# Patient Record
Sex: Male | Born: 1995 | Race: White | Hispanic: No | Marital: Single
Health system: Southern US, Community
[De-identification: ages and names within clinical notes are randomized; demographics above are authoritative.]

---
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
## 2015-11-30 ENCOUNTER — Emergency Department (HOSPITAL_COMMUNITY): Payer: Self-pay

## 2015-11-30 ENCOUNTER — Emergency Department (HOSPITAL_COMMUNITY)
Admission: EM | Admit: 2015-11-30 | Discharge: 2015-12-01 | Disposition: A | Discharge status: Home / Self Care | Payer: Self-pay | Attending: Emergency Medicine | Admitting: Emergency Medicine

## 2015-11-30 ENCOUNTER — Encounter (HOSPITAL_COMMUNITY): Payer: Self-pay

## 2015-11-30 DIAGNOSIS — T50901A Poisoning by unspecified drugs, medicaments and biological substances, accidental (unintentional), initial encounter: Secondary | ICD-10-CM | POA: Insufficient documentation

## 2015-11-30 DIAGNOSIS — S43401A Unspecified sprain of right shoulder joint, initial encounter: Secondary | ICD-10-CM | POA: Insufficient documentation

## 2015-11-30 DIAGNOSIS — Y939 Activity, unspecified: Secondary | ICD-10-CM | POA: Insufficient documentation

## 2015-11-30 DIAGNOSIS — Y999 Unspecified external cause status: Secondary | ICD-10-CM | POA: Insufficient documentation

## 2015-11-30 DIAGNOSIS — T50904A Poisoning by unspecified drugs, medicaments and biological substances, undetermined, initial encounter: Secondary | ICD-10-CM

## 2015-11-30 DIAGNOSIS — R000 Tachycardia, unspecified: Secondary | ICD-10-CM | POA: Insufficient documentation

## 2015-11-30 DIAGNOSIS — Y929 Unspecified place or not applicable: Secondary | ICD-10-CM | POA: Insufficient documentation

## 2015-11-30 DIAGNOSIS — X58XXXA Exposure to other specified factors, initial encounter: Secondary | ICD-10-CM | POA: Insufficient documentation

## 2015-11-30 LAB — COMPREHENSIVE METABOLIC PANEL
ALT: 16 U/L — AB (ref 17–63)
AST: 26 U/L (ref 15–41)
Albumin: 4.3 g/dL (ref 3.5–5.0)
Alkaline Phosphatase: 47 U/L (ref 38–126)
Anion gap: 11 (ref 5–15)
BUN: 11 mg/dL (ref 6–20)
CALCIUM: 8.9 mg/dL (ref 8.9–10.3)
CO2: 21 mmol/L — ABNORMAL LOW (ref 22–32)
CREATININE: 1.33 mg/dL — AB (ref 0.61–1.24)
Chloride: 106 mmol/L (ref 101–111)
GLUCOSE: 227 mg/dL — AB (ref 65–99)
Potassium: 3.4 mmol/L — ABNORMAL LOW (ref 3.5–5.1)
SODIUM: 138 mmol/L (ref 135–145)
TOTAL PROTEIN: 7.2 g/dL (ref 6.5–8.1)
Total Bilirubin: 0.6 mg/dL (ref 0.3–1.2)

## 2015-11-30 LAB — CBC WITH DIFFERENTIAL/PLATELET
Basophils Absolute: 0 10*3/uL (ref 0.0–0.1)
Basophils Relative: 0 %
EOS ABS: 0 10*3/uL (ref 0.0–0.7)
EOS PCT: 0 %
HCT: 40.6 % (ref 39.0–52.0)
HEMOGLOBIN: 13.5 g/dL (ref 13.0–17.0)
LYMPHS ABS: 1.3 10*3/uL (ref 0.7–4.0)
Lymphocytes Relative: 14 %
MCH: 28.7 pg (ref 26.0–34.0)
MCHC: 33.3 g/dL (ref 30.0–36.0)
MCV: 86.4 fL (ref 78.0–100.0)
MONO ABS: 0.5 10*3/uL (ref 0.1–1.0)
MONOS PCT: 6 %
NEUTROS PCT: 80 %
Neutro Abs: 7.8 10*3/uL — ABNORMAL HIGH (ref 1.7–7.7)
PLATELETS: 159 10*3/uL (ref 150–400)
RBC: 4.7 MIL/uL (ref 4.22–5.81)
RDW: 12.2 % (ref 11.5–15.5)
WBC: 9.7 10*3/uL (ref 4.0–10.5)

## 2015-11-30 LAB — CK: Total CK: 159 U/L (ref 49–397)

## 2015-11-30 LAB — RAPID URINE DRUG SCREEN, HOSP PERFORMED
AMPHETAMINES: NOT DETECTED
Barbiturates: NOT DETECTED
Benzodiazepines: NOT DETECTED
Cocaine: NOT DETECTED
Opiates: NOT DETECTED
TETRAHYDROCANNABINOL: NOT DETECTED

## 2015-11-30 LAB — CBG MONITORING, ED
GLUCOSE-CAPILLARY: 171 mg/dL — AB (ref 65–99)
GLUCOSE-CAPILLARY: 98 mg/dL (ref 65–99)

## 2015-11-30 LAB — I-STAT CG4 LACTIC ACID, ED
Lactic Acid, Venous: 4 mmol/L (ref 0.5–2.0)
Lactic Acid, Venous: 0.96 mmol/L (ref 0.5–2.0)

## 2015-11-30 MED ORDER — SODIUM CHLORIDE 0.9 % IV BOLUS (SEPSIS)
1000.0000 mL | Freq: Once | INTRAVENOUS | Status: AC
  Administered 2015-11-30: 1000 mL via INTRAVENOUS

## 2015-11-30 MED ORDER — ONDANSETRON HCL 4 MG/2ML IJ SOLN
INTRAMUSCULAR | Status: AC
  Filled 2015-11-30: qty 2

## 2015-11-30 MED ORDER — NALOXONE HCL 2 MG/2ML IJ SOSY
1.0000 mg/h | PREFILLED_SYRINGE | INTRAVENOUS | Status: DC
  Filled 2015-11-30: qty 4

## 2015-11-30 MED ORDER — ONDANSETRON HCL 4 MG/2ML IJ SOLN
4.0000 mg | Freq: Once | INTRAMUSCULAR | Status: AC
  Administered 2015-11-30: 4 mg via INTRAVENOUS

## 2015-11-30 MED ORDER — NALOXONE HCL 2 MG/2ML IJ SOSY
1.0000 mg | PREFILLED_SYRINGE | Freq: Once | INTRAMUSCULAR | Status: AC
  Administered 2015-11-30: 1 mg via INTRAVENOUS

## 2015-11-30 NOTE — ED Notes (Signed)
Patient transported to CT 

## 2015-11-30 NOTE — ED Notes (Signed)
Pt informed of the need of a urine sample. Pt attempted to urinate but was unable to do so. Will try again later.

## 2015-11-30 NOTE — ED Notes (Signed)
Pt reports taking a pill and he stated he does not know what it was and has never taken it before, responds to narcan.

## 2015-11-30 NOTE — ED Notes (Signed)
Pt comes from a friends house, apneic on sceen, given 2mg  Narcan and another 1 mg given. Pt then became combative and unresponsive again. Pupils now dilated, flows some commands.

## 2015-11-30 NOTE — ED Provider Notes (Signed)
CSN: [id number]     Arrival date & time 11/30/15  2002 History   First MD Initiated Contact with Patient 11/30/15 2009     Chief Complaint  Patient presents with  . Drug Overdose     HPI Comments: 20 y.o. Male presents to the ED due to an ingestion of an unknown substance. He was initially apneic on scene at his friend's house, and responded to Narcan. Became very combative with EMS. Upon arrival, he is able to state his name.   Patient is a 20 y.o. male presenting with Ingested Medication. The history is provided by the patient.  Ingestion This is a new problem. The current episode started today. Episode frequency: Unable to specify. The problem has been gradually improving. Nothing aggravates the symptoms. Treatments tried: Narcan en route. The treatment provided moderate relief.    History reviewed. No pertinent past medical history.   History reviewed. No pertinent past surgical history.   No family history on file.   Social History  Substance Use Topics  . Smoking status: None  . Smokeless tobacco: None  . Alcohol Use: None    Review of Systems  Unable to perform ROS: Mental status change    Allergies  Review of patient's allergies indicates no known allergies.  Home Medications   Prior to Admission medications   Not on File   BP 164/103 mmHg  Pulse 109  Temp(Src) 97.5 F (36.4 C)  Resp 24  SpO2 100% Physical Exam  Constitutional: He appears well-developed and well-nourished. No distress.  HENT:  Head: Normocephalic and atraumatic.  Right Ear: External ear normal.  Left Ear: External ear normal.  Eyes: Pupils are equal, round, and reactive to light.  Pupils are dilated to approximately 6mm, round and reactive  Neck: Normal range of motion.  Cardiovascular: Regular rhythm.  Tachycardia present.   Pulmonary/Chest: Effort normal and breath sounds normal.  Abdominal: Soft. He exhibits no distension. There is no tenderness.  Skin: Skin is warm and dry. He is  not diaphoretic.  Warm to palpation  Psychiatric: His affect is labile. His speech is slurred.    ED Course  Procedures  Labs Review Labs Reviewed  CBC WITH DIFFERENTIAL/PLATELET - Abnormal; Notable for the following:    Neutro Abs 7.8 (*)    All other components within normal limits  COMPREHENSIVE METABOLIC PANEL - Abnormal; Notable for the following:    Potassium 3.4 (*)    CO2 21 (*)    Glucose, Bld 227 (*)    Creatinine, Ser 1.33 (*)    ALT 16 (*)    All other components within normal limits  I-STAT CG4 LACTIC ACID, ED - Abnormal; Notable for the following:    Lactic Acid, Venous 4.00 (*)    All other components within normal limits  CBG MONITORING, ED - Abnormal; Notable for the following:    Glucose-Capillary 171 (*)    All other components within normal limits  CK  URINE RAPID DRUG SCREEN, HOSP PERFORMED  ACETAMINOPHEN LEVEL  SALICYLATE LEVEL  ETHANOL  CBG MONITORING, ED  CBG MONITORING, ED  I-STAT CG4 LACTIC ACID, ED  CBG MONITORING, ED  CBG MONITORING, ED    Imaging Review Dg Shoulder Right  12/01/2015  CLINICAL DATA:  Acute onset of right shoulder pain. Unable to move arm. Initial encounter. EXAM: RIGHT SHOULDER - 2+ VIEW COMPARISON:  MRI of the right shoulder performed 03/04/2009 FINDINGS: There is no evidence of fracture or dislocation. The right humeral head is seated  within the glenoid fossa. The acromioclavicular joint is unremarkable in appearance. No significant soft tissue abnormalities are seen. The visualized portions of the right lung are clear. IMPRESSION: No evidence of fracture or dislocation. Electronically Signed   By: Roanna Raider M.D.   On: 12/01/2015 00:48   Ct Head Wo Contrast  11/30/2015  CLINICAL DATA:  Initial evaluation for acute unresponsiveness. Cardiac arrest status post overdose. EXAM: CT HEAD WITHOUT CONTRAST CT CERVICAL SPINE WITHOUT CONTRAST TECHNIQUE: Multidetector CT imaging of the head and cervical spine was performed following  the standard protocol without intravenous contrast. Multiplanar CT image reconstructions of the cervical spine were also generated. COMPARISON:  None. FINDINGS: CT HEAD FINDINGS There is no acute intracranial hemorrhage or infarct. No mass lesion or midline shift. Gray-white matter differentiation is fairly well-maintained without overt evidence for generalized cerebral anoxia at this time. Ventricles are normal in size without evidence of hydrocephalus. CSF containing spaces are within normal limits. No extra-axial fluid collection. The calvarium is intact. Orbital soft tissues are within normal limits. The paranasal sinuses and mastoid air cells are well pneumatized and free of fluid. Small retention cyst partially visualized within the right maxillary sinus. Scalp soft tissues are unremarkable. CT CERVICAL SPINE FINDINGS Straightening of the normal cervical lordosis, which may be related to patient positioning. Vertebral body heights are preserved. Normal C1-2 articulations are intact. No prevertebral soft tissue swelling. No acute fracture or listhesis. Visualized soft tissues of the neck are within normal limits. Visualized lung apices are clear without evidence of apical pneumothorax. IMPRESSION: CT BRAIN: No acute intracranial process identified. CT CERVICAL SPINE: No acute traumatic injury within the cervical spine. Electronically Signed   By: Rise Mu M.D.   On: 11/30/2015 21:46   Ct Cervical Spine Wo Contrast  11/30/2015  CLINICAL DATA:  Initial evaluation for acute unresponsiveness. Cardiac arrest status post overdose. EXAM: CT HEAD WITHOUT CONTRAST CT CERVICAL SPINE WITHOUT CONTRAST TECHNIQUE: Multidetector CT imaging of the head and cervical spine was performed following the standard protocol without intravenous contrast. Multiplanar CT image reconstructions of the cervical spine were also generated. COMPARISON:  None. FINDINGS: CT HEAD FINDINGS There is no acute intracranial hemorrhage  or infarct. No mass lesion or midline shift. Gray-white matter differentiation is fairly well-maintained without overt evidence for generalized cerebral anoxia at this time. Ventricles are normal in size without evidence of hydrocephalus. CSF containing spaces are within normal limits. No extra-axial fluid collection. The calvarium is intact. Orbital soft tissues are within normal limits. The paranasal sinuses and mastoid air cells are well pneumatized and free of fluid. Small retention cyst partially visualized within the right maxillary sinus. Scalp soft tissues are unremarkable. CT CERVICAL SPINE FINDINGS Straightening of the normal cervical lordosis, which may be related to patient positioning. Vertebral body heights are preserved. Normal C1-2 articulations are intact. No prevertebral soft tissue swelling. No acute fracture or listhesis. Visualized soft tissues of the neck are within normal limits. Visualized lung apices are clear without evidence of apical pneumothorax. IMPRESSION: CT BRAIN: No acute intracranial process identified. CT CERVICAL SPINE: No acute traumatic injury within the cervical spine. Electronically Signed   By: Rise Mu M.D.   On: 11/30/2015 21:46   I have personally reviewed and evaluated these images and lab results as part of my medical decision-making.   EKG Interpretation   Date/Time:  Wednesday November 30 2015 20:09:45 EDT Ventricular Rate:  124 PR Interval:  143 QRS Duration: 108 QT Interval:  320 QTC Calculation: 460  R Axis:   57 Text Interpretation:  Sinus tachycardia Borderline T wave abnormalities No  previous ECGs available Confirmed by LITTLE MD, RACHEL [phone]) on  11/30/2015 8:28:33 PM      MDM   Final diagnoses:  Substance abuse  Overdose, undetermined intent, initial encounter   20 y.o. male presents to the ED after an ingestion of an unknown substance. He was initially apneic on scene but responded to Narcan and became very combative.  Remainder of history of present illness, review of systems, and exam as above. All are limited by the patient's altered mental status.  CBC unremarkable. CMP notable for mildly elevated creatinine. Lactate 4. CK not elevated. UDS negative.  He was given IV hydration, 1 dose of Narcan. Repeat lactate within normal limits. He required no further doses of Narcan, however on reassessments he continues to be altered with mydriasis. He is awake, alert, but has repetitive questioning and is not making any sense. He states he cannot see, but then looks directly at me and tells me the light is too bright.   He likely ingested a combination of drugs. Likely had a component of opiates, given his response to Narcan. This does not explain his warm skin, tachycardia, mydriasis. He has no idea what he took, and has no idea what he thought he was trying to take.  I discussed this case with the hospitalist for admission. Hospitalist evaluated the patient in the ED, and has declined admission currently pending clinical improvement in the ED.   Signout was given to Dr. Bebe Shaggy. Acetaminophen, salicylate, ethanol levels are pending. Plan to follow up these labs and continue to observe the patient in the ED for clinical improvement.   Case managed with my attending, Dr. Clarene Duke.   Maxine Glenn, MD 12/01/15 0104  Laurence Spates, MD 12/12/15 1341

## 2015-12-01 ENCOUNTER — Emergency Department (HOSPITAL_COMMUNITY): Payer: Self-pay

## 2015-12-01 LAB — CBG MONITORING, ED
GLUCOSE-CAPILLARY: 115 mg/dL — AB (ref 65–99)
Glucose-Capillary: 109 mg/dL — ABNORMAL HIGH (ref 65–99)

## 2015-12-01 LAB — ETHANOL: Alcohol, Ethyl (B): <5 mg/dL (ref <5)

## 2015-12-01 LAB — ACETAMINOPHEN LEVEL

## 2015-12-01 LAB — SALICYLATE LEVEL

## 2015-12-01 NOTE — ED Provider Notes (Signed)
Labs reassuring Imaging negative Pt awake/alert Does not recall all details from event He does have h/o substance abuse in past Resource guide given   Adam Rhineonald Beau Ramsburg, MD 12/01/15 0302

## 2015-12-01 NOTE — Discharge Instructions (Signed)
Substance Abuse Treatment Programs ° °Intensive Outpatient Programs °[facility]     °[address]      °[place], [place]                   °[phone]      ° °[facility] °[address] °[place], [place] °[phone] ° °[facility]     °(Inpatient and outpatient)     °[address]           °[phone]   ° °[facility] °[phone] (Suboxone and Methadone) ° °[address]      °[place], [place] [address]      °[phone]      ° °[address] °[place], [place] °852-3033 ° °[facility] (Outpatient/Inpatient, Chemical)    °(insurance only) [phone]      °       °[facility] (Groups & Residential) °[place], [place] °[phone] ° °   °[facility]     °[address]     °[place], [place]      °[phone]      ° °[facility] (for caregivers and family) °[address] °[place], [place] °336-299-4655 ° ° ° ° ° °Residential Treatment Programs °[facility]      °[address], [place], [place] [address]  °[phone]      ° °[facility] °[address] [place], [place] [address] °[phone] ° °[facility]        °[phone]      ° °[facility] °[phone] ° °[facility]             °[address]                                         °[place], [place]                                                °[phone] or [phone]                              ° °[facility] [place] °[address] °[place], [address] °[phone] ° °[facility]    °[address]      °[place], [place]     °[phone]      ° °[facility] °[address] °[place], [place] °[phone] ° °[facility]   °[address]     °[place], [place] [address]     °[phone]      °Admissions: 8am-3pm M-F ° °[facility] °[address] °[place],  [place] °336-227-7417 ° °[facility]: Residential Program (90 Days)   °[place], [place]      °336-725-8389 or [phone]    ° °[facility] °[place],  °(Walk in Hours over the weekend or by referral) ° °[facility] [facility] °[address], [place],  [address] °([phone] ° °Crisis Mobile: [facility]:  1-877-626-1772 (for crisis response 24 hours a day) °[facility] Hotline:      1-800-256-2452 °Outpatient Psychiatry and Counseling ° °[facility]: Mobile Crisis   Management 24 hours:  1-877-626-1772 ° °[facility] sliding scale fee and walk in schedule: M-F 8am-12pm/1pm-3pm °[address]  °[place], [place] [address] °[phone] ° °[facility] °[address] °[place], [place] [address] °336-703-9650 ° °[facility] ([facility] known [facility])- new patient walk-in appointments available Monday - Friday 8am -3pm.          °[address] °[place], [place] [address] °336-676-6840 or crisis line- 336-676-6905 ° °[facility]/ Intensive Outpatient Therapy Program °[address] °[place], [place] [address] °336-832-9804 ° °[facility]                  °Crisis Services      °[phone]      °[address]     °[place], [place] [address]                ° °[facility]   °[facility] °[phone] °[address] °[place], [place] [address] ° ° °[facility]          °[address]  °[place], [place] [address]       °[phone] ° °[facility] °[address] °[place], [place] [address] °336-292-1510 ° °[facility]    °[address]    °[place], [place] [address]     °[phone]      ° °Parish McKinney, [place]     °[address]     °[place] [address]     °[phone]     °  °[facility] °[address] °[place] [address] ° °[facility]     °[address]     °[place], [place]      °[phone]      ° °[facility] °Shamsher Ahluwalia, MD °[address] °[place], [place] [address] °[phone] ° °[facility]     °[address]     °[place], [place] [address]     °336-274-1237      ° °[facility] °[address] °[place], [place] [address] °336-854-4450 °Resources for Temporary Residential Assistance/Crisis Centers ° °DAY CENTERS °[facility] °M-F 8am-3pm   °[address] [place], [place] [address]   336-332-0824 °Services include: laundry, barbering, support groups, case management, phone  & computer access, showers, AA/NA mtgs, mental health/substance abuse nurse, job skills class, disability information, VA assistance, spiritual classes, etc.  ° °HOMELESS SHELTERS ° °[facility]     °[facility]   °[address], [place]     °[phone]       °       °[facility] (women and children)       °[address] °[place], [place] [address] °336-275-0820 °[email or web address] for application and process °Application Required ° °[facility]   °[address]    °[place] [address]     °[phone]       °             °[facility] °[address] °, [place] [address] °336.273.5572 °336-235-0363(schedule application appt.) °Application Required ° °[facility] (women only)    °[address]     °[place], [place] [address]     °336-884-1039      °  Intake starts 6pm daily °Need valid ID, SSC, & Police report °[facility] [place] °[address] °[place] °336-881-5420 °Application Required ° °[facility] (men only)     °[address]      °[place], [place]     °[phone]      ° °[facility] °(Pregnant women only) °[address] °[place], [place] °336-275-0206 ° °[facility]      °[address]      °[place], [place] [address]     °[phone]      °       °[facility] [place] [facility] °[address] °[place], [place] °336-723-1848 °90 day commitment/SA/Application process ° °[facility](men only)     °[address]     °[place], [place]     °[phone]       °Check-in at 7pm     °       °[facility] °[address] °[place], [place] [address] °336-248-6684 °Men/Women/Women and Children must be there by 7 pm ° °[facility] °[place],  °336-722-8721                ° °

## 2015-12-01 NOTE — ED Provider Notes (Signed)
Patient awake/alert No distress noted He reports shoulder pain, will get shoulder xray Labs thus far are unremarkable Will continue to monitor   Zadie Rhineonald Anchor Dwan, MD 12/01/15 [phone]

## 2015-12-01 NOTE — ED Notes (Signed)
Patient transported to X-ray 

## 2017-04-16 IMAGING — DX DG SHOULDER 2+V*R*
2 series · 2 of 2 positions shown · non-contrast
Comparison: MRI of the right shoulder performed 03/04/2009

CLINICAL DATA: Acute onset of right shoulder pain. Unable to move
arm. Initial encounter.

EXAM:
RIGHT SHOULDER - 2+ VIEW

[shoulder grashey]
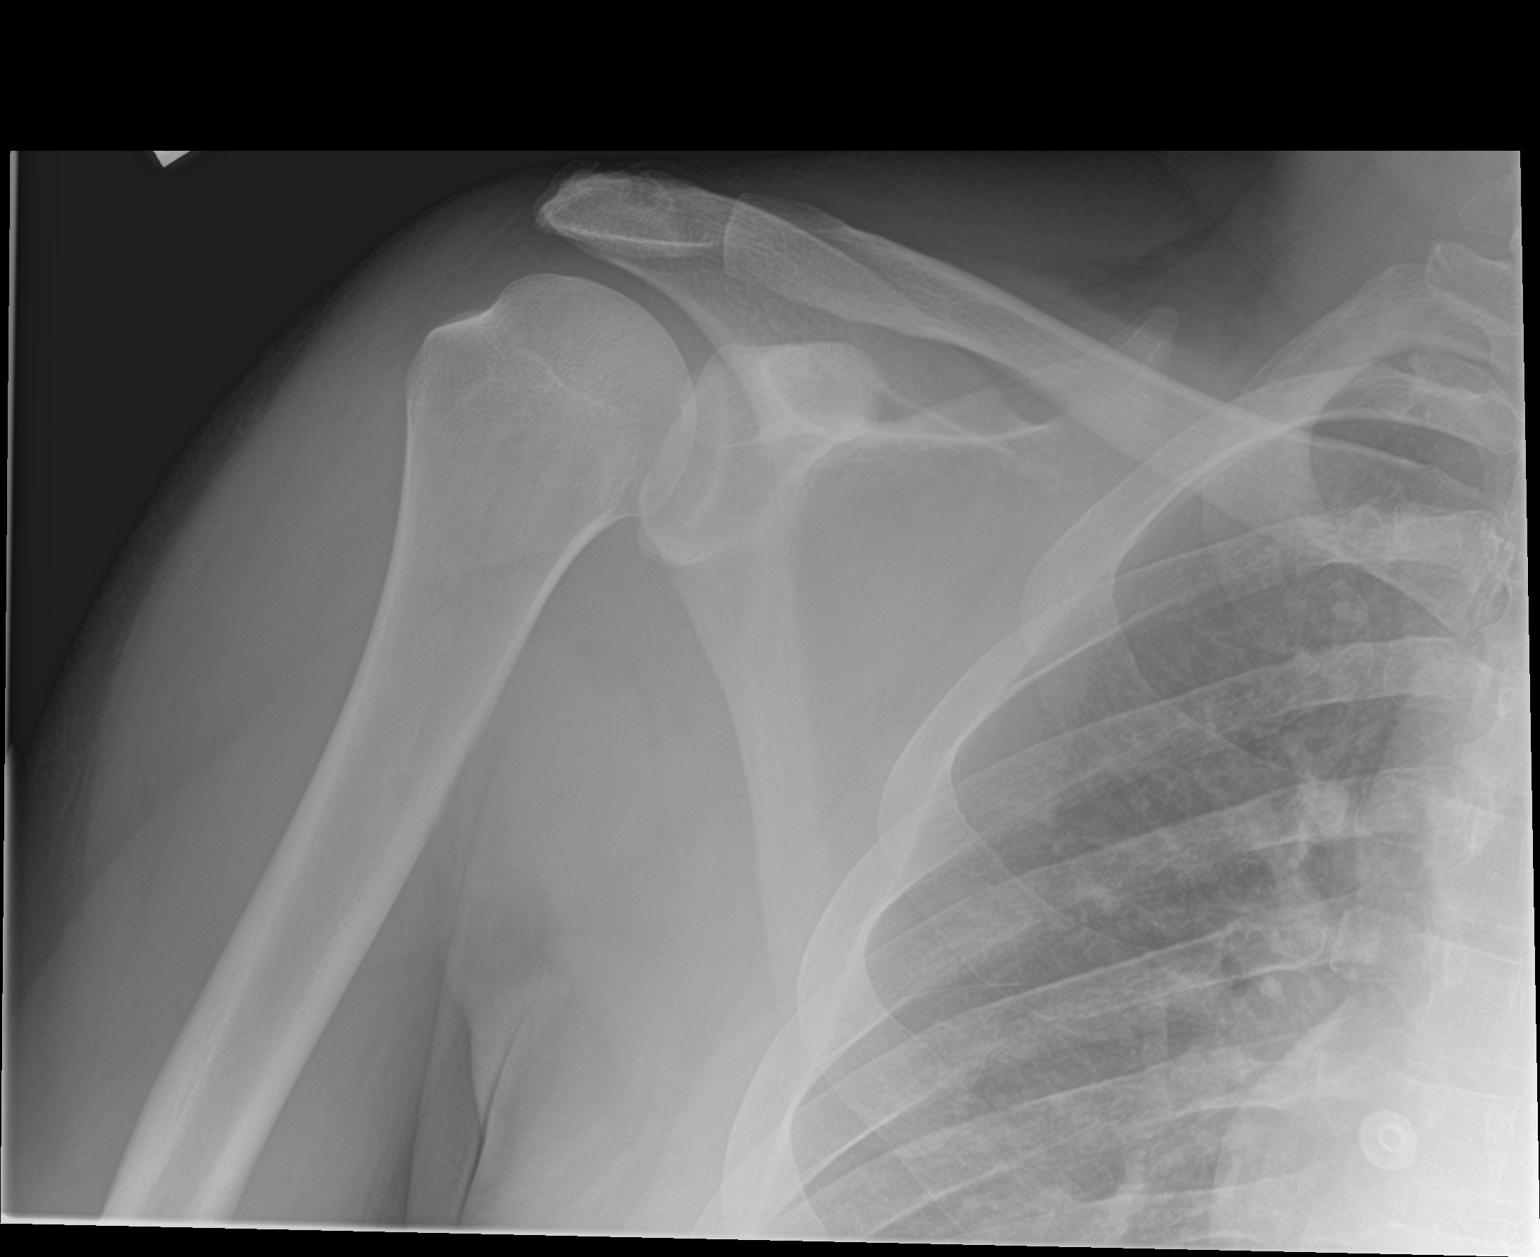

[shoulder y view]
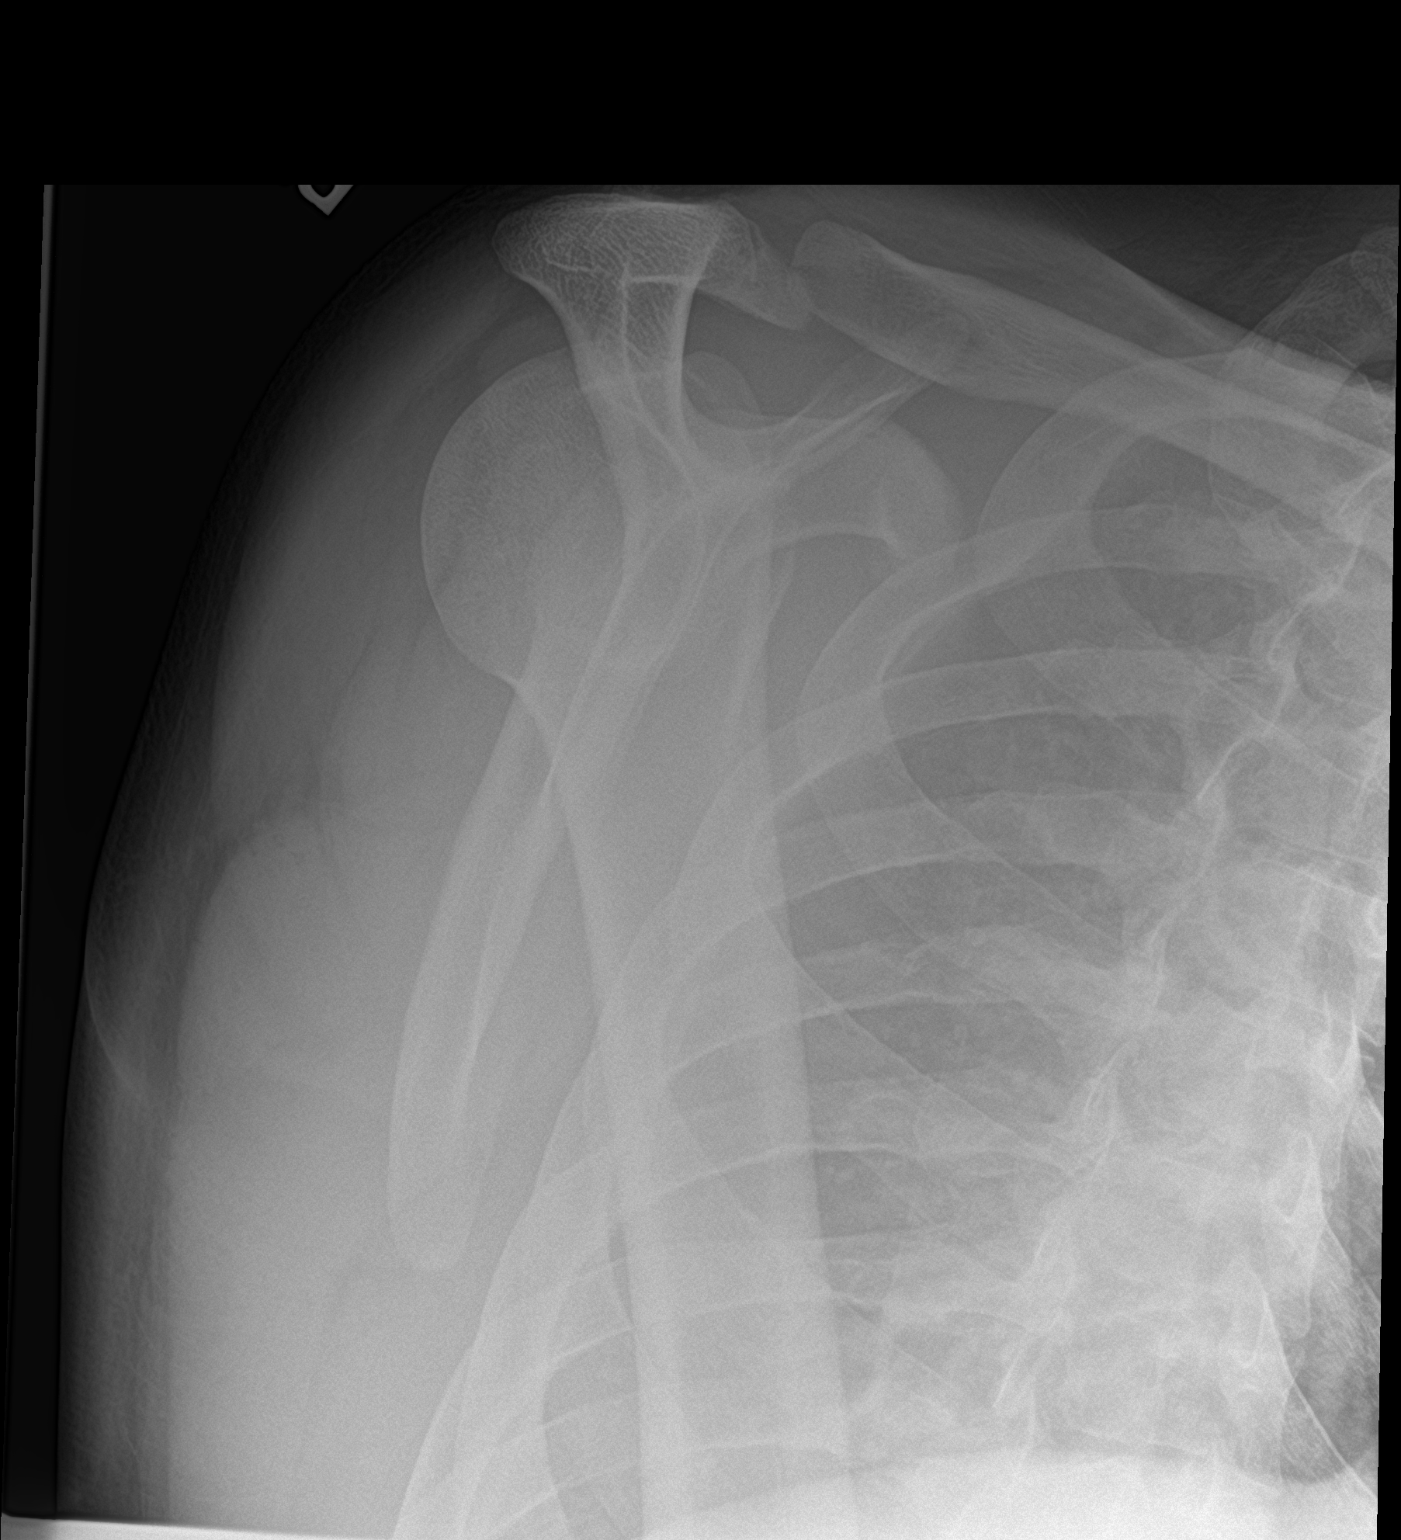

[2 of 2 positions shown; findings below may reference images not displayed]

FINDINGS: There is no evidence of fracture or dislocation. The right humeral
head is seated within the glenoid fossa. The acromioclavicular joint
is unremarkable in appearance. No significant soft tissue
abnormalities are seen. The visualized portions of the right lung
are clear.
IMPRESSION: No evidence of fracture or dislocation.

## 2022-12-17 DEATH — deceased
